# Patient Record
Sex: Male | Born: 1960 | Race: White | Hispanic: No | Marital: Married | Smoking: Former smoker
Health system: Southern US, Community
[De-identification: ages and names within clinical notes are randomized; demographics above are authoritative.]

## PROBLEM LIST (undated history)

## (undated) DIAGNOSIS — K222 Esophageal obstruction: Secondary | ICD-10-CM

## (undated) DIAGNOSIS — E785 Hyperlipidemia, unspecified: Secondary | ICD-10-CM

## (undated) DIAGNOSIS — M1990 Unspecified osteoarthritis, unspecified site: Secondary | ICD-10-CM

## (undated) DIAGNOSIS — K648 Other hemorrhoids: Secondary | ICD-10-CM

## (undated) DIAGNOSIS — K5790 Diverticulosis of intestine, part unspecified, without perforation or abscess without bleeding: Secondary | ICD-10-CM

## (undated) DIAGNOSIS — K219 Gastro-esophageal reflux disease without esophagitis: Secondary | ICD-10-CM

## (undated) DIAGNOSIS — K635 Polyp of colon: Secondary | ICD-10-CM

## (undated) DIAGNOSIS — K449 Diaphragmatic hernia without obstruction or gangrene: Secondary | ICD-10-CM

## (undated) DIAGNOSIS — Z8719 Personal history of other diseases of the digestive system: Secondary | ICD-10-CM

## (undated) DIAGNOSIS — D414 Neoplasm of uncertain behavior of bladder: Secondary | ICD-10-CM

## (undated) HISTORY — PX: TONSILLECTOMY: SUR1361

## (undated) HISTORY — PX: ELBOW SURGERY: SHX618

## (undated) HISTORY — PX: COLONOSCOPY: SHX174

## (undated) HISTORY — DX: Diaphragmatic hernia without obstruction or gangrene: K44.9

## (undated) HISTORY — DX: Diverticulosis of intestine, part unspecified, without perforation or abscess without bleeding: K57.90

## (undated) HISTORY — DX: Esophageal obstruction: K22.2

## (undated) HISTORY — DX: Other hemorrhoids: K64.8

## (undated) HISTORY — PX: POLYPECTOMY: SHX149

## (undated) HISTORY — DX: Polyp of colon: K63.5

## (undated) HISTORY — DX: Neoplasm of uncertain behavior of bladder: D41.4

## (undated) HISTORY — DX: Personal history of other diseases of the digestive system: Z87.19

## (undated) HISTORY — DX: Hyperlipidemia, unspecified: E78.5

---

## 2000-02-08 ENCOUNTER — Encounter: Payer: Self-pay | Admitting: Orthopaedic Surgery

## 2000-02-08 ENCOUNTER — Encounter: Admission: RE | Admit: 2000-02-08 | Discharge: 2000-02-08 | Payer: Self-pay | Admitting: Orthopaedic Surgery

## 2000-10-01 ENCOUNTER — Encounter: Admission: RE | Admit: 2000-10-01 | Discharge: 2000-10-01 | Payer: Self-pay | Admitting: Orthopaedic Surgery

## 2000-10-01 ENCOUNTER — Encounter: Payer: Self-pay | Admitting: Orthopaedic Surgery

## 2001-05-21 HISTORY — PX: ANTERIOR CRUCIATE LIGAMENT REPAIR: SHX115

## 2001-05-21 HISTORY — PX: ESOPHAGEAL DILATION: SHX303

## 2001-06-11 ENCOUNTER — Encounter: Payer: Self-pay | Admitting: Gastroenterology

## 2006-05-07 ENCOUNTER — Encounter: Admission: RE | Admit: 2006-05-07 | Discharge: 2006-05-07 | Payer: Self-pay | Admitting: Orthopaedic Surgery

## 2006-05-21 HISTORY — PX: KNEE SURGERY: SHX244

## 2007-05-22 HISTORY — PX: ELBOW ARTHROPLASTY: SHX928

## 2008-01-23 IMAGING — CT CT EXTREM UP W/O CM*R*
3 of 4 series · 12 of 27 positions shown, 14 images · IV contrast (agent unspecified)
Comparison: none

CLINICAL DATA: Chronic right elbow pain of unknown etiology. 
 CT RIGHT ELBOW WITHOUT CONTRAST:
TECHNIQUE: Multidetector CT imaging was performed according to the standard protocol.  No intravenous contrast was administered.  Multiplanar CT image reconstructions were also generated.

[Series 3: upper ext · axial · 0.23mm/px · z∈[+20,+110]mm · 4 of 122 slices shown, 5 images (1 of 2)]
[im 25/122  soft-tissue]
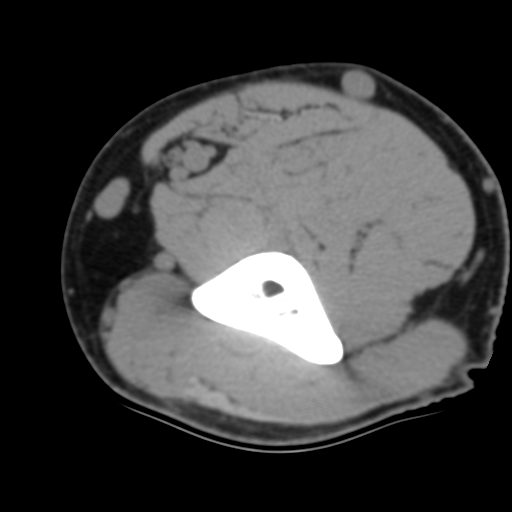
[im 25/122  bone]
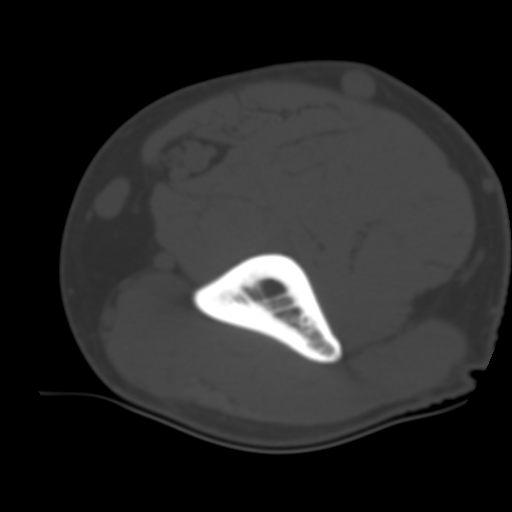
[im 49/122  bone]
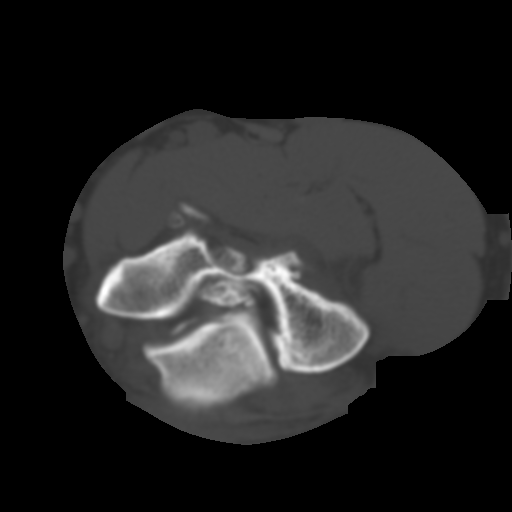
[im 73/122  bone]
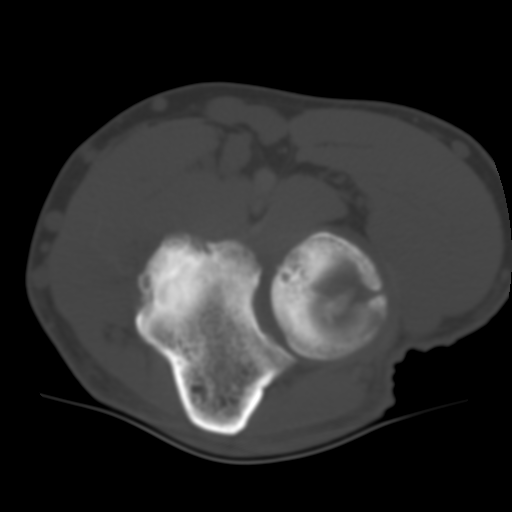
[im 97/122  bone]
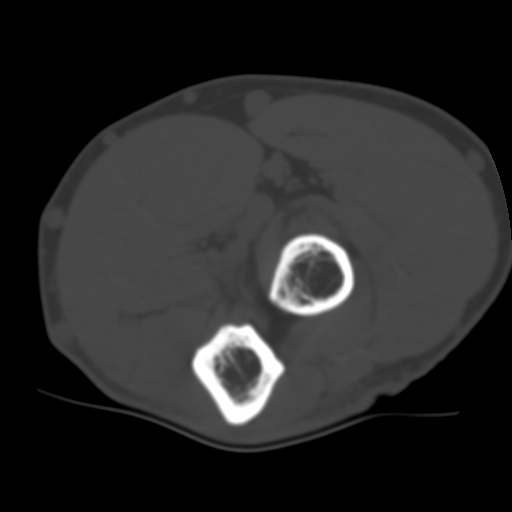

[Series 4: upper ext · axial · 0.23mm/px · z∈[+28,+103]mm · 3 of 122 slices shown (2 of 2)]
[im 31/122  bone]
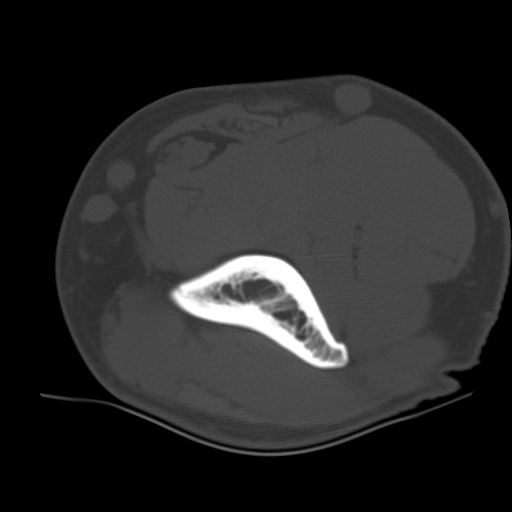
[im 61/122  bone]
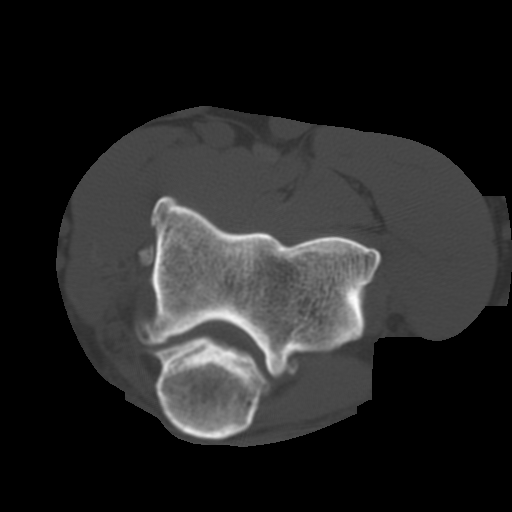
[im 91/122  bone]
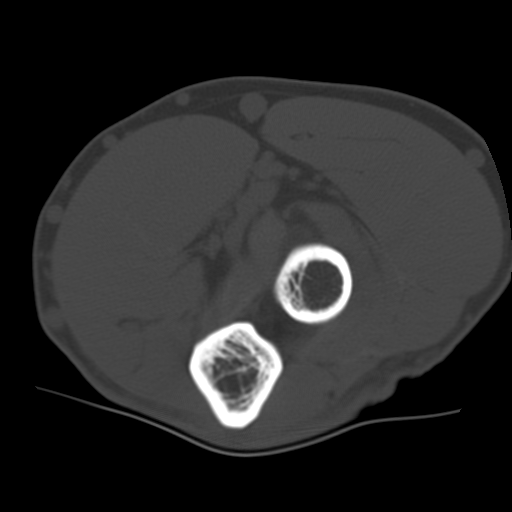

[Series 200: coronal · coronal · 0.30mm/px · 5 of 60 slices shown, 6 images]
[im 20/60  bone]
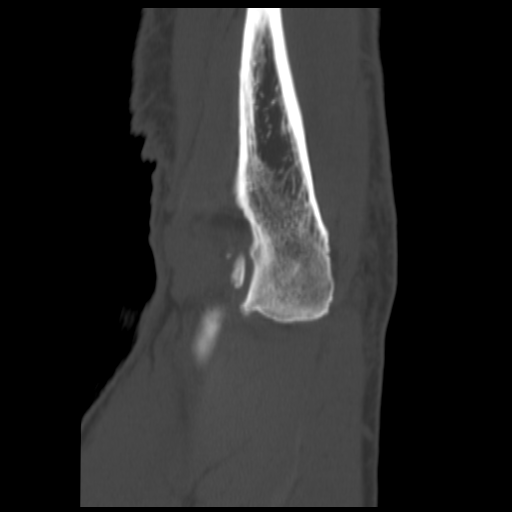
[im 25/60  bone]
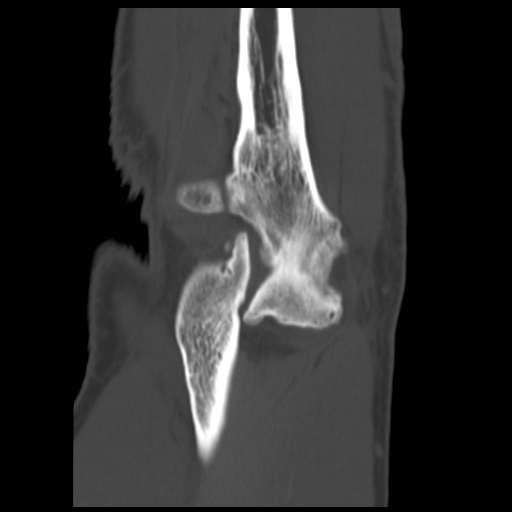
[im 30/60  soft-tissue]
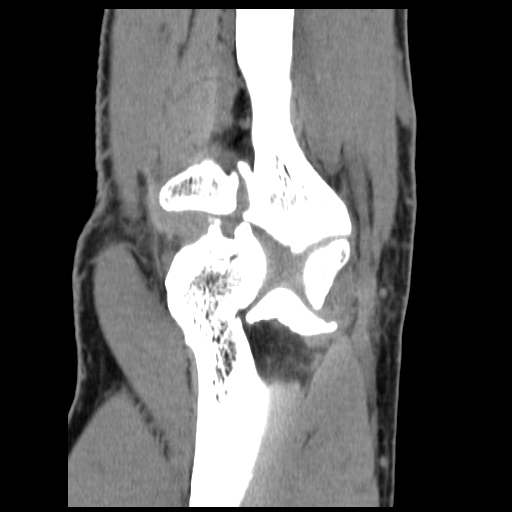
[im 30/60  bone]
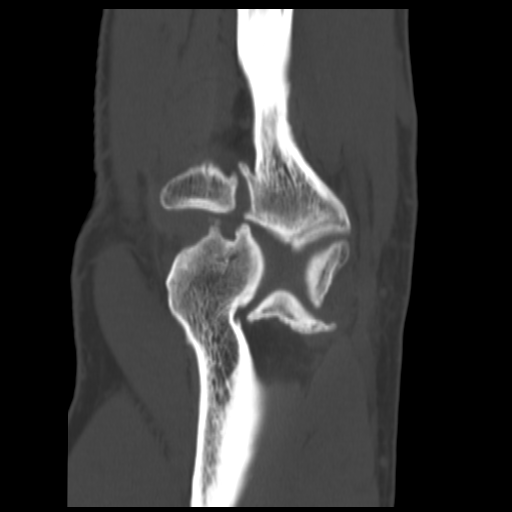
[im 35/60  bone]
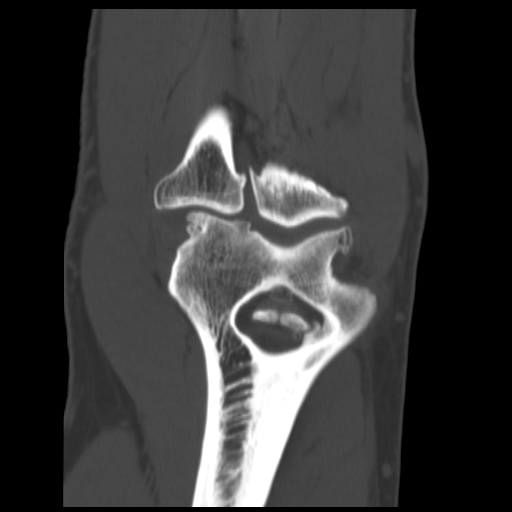
[im 40/60  bone]
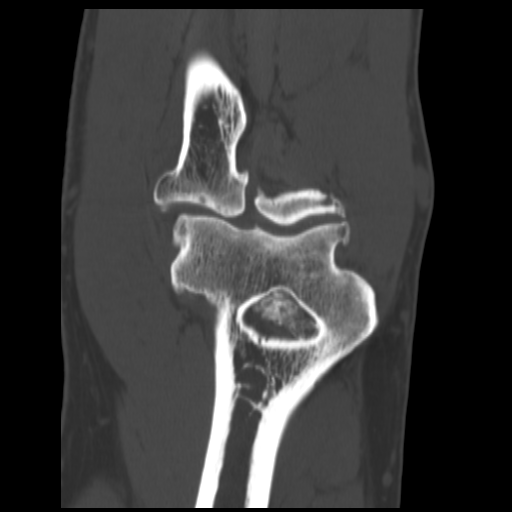

[12 of 27 positions shown; findings below may reference images not displayed]

FINDINGS: The patient has multiple intra-articular loose bodies located throughout the joint.  There are osteoarthritic type changes with hyaline cartilage loss and early subchondral cyst formation affecting the humerus, the radius, and the ulna.  There are marginal osteophytes.  The muscles and tendons appear intact.
IMPRESSION: 1.  Apparent osteoarthritis throughout the elbow joint with hyaline cartilage loss, joint space narrowing, osteophyte formation, subchondral cyst formation, and multiple intra-articular loose bodies.  I suppose one might consider a diagnosis of primary or secondary osteochondromatosis but I think it is more likely that we are dealing with changes related to osteoarthritis.

## 2008-09-29 ENCOUNTER — Ambulatory Visit: Payer: Self-pay | Admitting: Gastroenterology

## 2008-09-29 ENCOUNTER — Telehealth: Payer: Self-pay | Admitting: Physician Assistant

## 2008-09-29 DIAGNOSIS — K625 Hemorrhage of anus and rectum: Secondary | ICD-10-CM

## 2008-09-29 DIAGNOSIS — K219 Gastro-esophageal reflux disease without esophagitis: Secondary | ICD-10-CM

## 2008-09-29 DIAGNOSIS — K6289 Other specified diseases of anus and rectum: Secondary | ICD-10-CM

## 2008-09-29 DIAGNOSIS — K222 Esophageal obstruction: Secondary | ICD-10-CM

## 2008-10-27 ENCOUNTER — Telehealth: Payer: Self-pay | Admitting: Physician Assistant

## 2008-10-28 ENCOUNTER — Ambulatory Visit: Payer: Self-pay | Admitting: Gastroenterology

## 2008-10-28 ENCOUNTER — Encounter: Payer: Self-pay | Admitting: Gastroenterology

## 2008-11-03 ENCOUNTER — Encounter: Payer: Self-pay | Admitting: Gastroenterology

## 2009-10-18 ENCOUNTER — Telehealth: Payer: Self-pay | Admitting: Gastroenterology

## 2009-10-19 ENCOUNTER — Ambulatory Visit: Payer: Self-pay | Admitting: Gastroenterology

## 2009-10-19 DIAGNOSIS — Z860100 Personal history of colon polyps, unspecified: Secondary | ICD-10-CM | POA: Insufficient documentation

## 2009-10-19 DIAGNOSIS — K5730 Diverticulosis of large intestine without perforation or abscess without bleeding: Secondary | ICD-10-CM | POA: Insufficient documentation

## 2009-10-19 DIAGNOSIS — K648 Other hemorrhoids: Secondary | ICD-10-CM | POA: Insufficient documentation

## 2009-10-19 LAB — CONVERTED CEMR LAB
Basophils Absolute: 0 10*3/uL (ref 0.0–0.1)
Basophils Relative: 0.5 % (ref 0.0–3.0)
Eosinophils Absolute: 0.1 10*3/uL (ref 0.0–0.7)
Eosinophils Relative: 1.7 % (ref 0.0–5.0)
HCT: 41.3 % (ref 39.0–52.0)
Hemoglobin: 14.5 g/dL (ref 13.0–17.0)
Lymphocytes Relative: 24.8 % (ref 12.0–46.0)
Lymphs Abs: 1.9 10*3/uL (ref 0.7–4.0)
MCHC: 35.2 g/dL (ref 30.0–36.0)
MCV: 90 fL (ref 78.0–100.0)
Monocytes Absolute: 0.5 10*3/uL (ref 0.1–1.0)
Monocytes Relative: 6.7 % (ref 3.0–12.0)
Neutro Abs: 5.1 10*3/uL (ref 1.4–7.7)
Neutrophils Relative %: 66.3 % (ref 43.0–77.0)
Platelets: 240 10*3/uL (ref 150.0–400.0)
RBC: 4.58 M/uL (ref 4.22–5.81)
RDW: 13 % (ref 11.5–14.6)
WBC: 7.6 10*3/uL (ref 4.5–10.5)

## 2010-06-20 NOTE — Assessment & Plan Note (Signed)
Summary: rectal bleeding/sheri   History of Present Illness Visit Type: Follow-up Visit Primary GI MD: Elie Goody MD [facility] Primary Provider: Rudi Heap, MD Requesting Provider: n/a Chief Complaint: Patient c/o several months worsening brbpr. 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 Patient denies any abdominal pain. No diarrhea or constipation. He also denies any nausea or fever. History of Present Illness:   50 Y.O MALE KNOWN TO DR. Russella Dar WHO UNDERWENT COLONOSCOPY ABOUT ONE YEAR AGO FOR C/O RECTAL BLEEDING. HA HAS INTERNAL HEMORRHOIDS WHICH WERE INJECTED,MILD DIVERTICULOSIS,AND 2 TINY POLYPS IN THE SIGMOID-HYPERPLASTIC. HE SAYS THE INJECTION THERAPY HELPED FOR AWHILE THEN THE BLEEDING GRADUALLY STARTED BACK AGAIN. OVER THE PAST FEW WEEKS HE HAS BEEN HAVING MORE FREQUENT, AND LARGER VOLUME BLEEDING THAN HE HAS HAD IN THE PAST. HE RELATES BRB ON THE TISSUE AND DRIPPING IN THE COMMODE. NO RECTAL OR ABDOMINAL PAIN, NO CONSTIPATION,STRAINING ETC.           Current Medications (verified): 1)  Prilosec Otc 20 Mg Tbec (Omeprazole Magnesium) .... Take One Every Other Day  Allergies (verified): No Known Drug Allergies  Past History:  Past Medical History: GERD, HX OF ESOPHAGEAL STRICTURE, S/P DILATION 2003 family hx hemachromatosis father/PT IS HETEROZYGOUS FOR C282Y MUTATION Hyperlipidemia bladder polyp, HX;S/P RESECTION DIVERTICULOSIS HYPERPLASTIC COLON POLYPS INTERNAL HEMORRHOIDS  Past Surgical History: Tonsillectomy arthroscopic surgery on right knee left knee ACL replacement polyp removed from bladder elbow surgery -right COLONOSCOPY 6/10,  Family History: Reviewed history from 09/29/2008 and no changes required. No FH of Colon Cancer: Family History of Colon Polyps:father  Social History: Reviewed history from 09/29/2008 and no changes required. Occupation: Art gallery manager Married with one  daughter Patient is a former smoker. - quit 30 years ago Alcohol Use - yes 2 Per day Daily Caffeine Use 3 per day Illicit Drug Use - no Patient gets regular exercise.  Review of Systems  The patient denies allergy/sinus, anemia, anxiety-new, arthritis/joint pain, back pain, blood in urine, breast changes/lumps, change in vision, confusion, cough, coughing up blood, depression-new, fainting, fatigue, fever, headaches-new, hearing problems, heart murmur, heart rhythm changes, itching, menstrual pain, muscle pains/cramps, night sweats, nosebleeds, pregnancy symptoms, shortness of breath, skin rash, sleeping problems, sore throat, swelling of feet/legs, swollen lymph glands, thirst - excessive , urination - excessive , urination changes/pain, urine leakage, vision changes, and voice change.         ROS OTHERWISE AS IN HPI  Vital Signs:  Patient profile:   50 year old male Height:      74 inches Weight:      232.25 pounds BMI:     29.93 BSA:     2.32 Pulse rate:   68 / minute Pulse rhythm:   regular BP sitting:   126 / 86  (left arm)  Vitals Entered By: Lamona Curl CMA Duncan Dull) (October 19, 2009 9:35 AM)  Physical Exam  General:  Well developed, well nourished, no acute distress. Head:  Normocephalic and atraumatic. Eyes:  PERRLA, no icterus. Lungs:  Clear throughout to auscultation. Heart:  Regular rate and rhythm; no murmurs, rubs,  or bruits. Abdomen:  SOFT, NONTENDER, NO MASS OR HSM,BS+ Rectal:  NOT DONE TODAY Extremities:  No clubbing, cyanosis, edema or deformities noted. Neurologic:  Alert and  oriented x4;  grossly normal neurologically. Psych:  Alert and cooperative. Normal mood and affect.   Impression & Recommendations:  Problem # 1:  RECTAL BLEEDING (ICD-569.3) Assessment Deteriorated 49 Y.O MALE WITH KNOWN INTERNAL HEMORRHOIDS,WITH RECURRENT HEMATOCHEZIA- CONSISTENT WITH HEMORRHOIDAL BLEEDING. HE HAD INJECTION OF HEMORRHOIDS ONE YEAR AGAIN WITH BRIEF RESOLUTION  OF BLEEDING.  CBC TODAY ANUSOL HC SUPP at bedtime  X 2 WEEKS THEN AS NEEDED. DISCUSSED OPTIONS OF CONSERVATIVE MANAGEMENT/MEDS,VS REPEAT PROCEDURE-WITH BANDING AS INJECTION DID NOT  RESOLVE THE BLEEDING FOR LONG,VS .SURGICAL REFERRAL. HE WANTS TO THINK ABOUT  THE BANDING AND WILL CALL BACK TO SCHEDULE WITH DR. Russella Dar. Orders: TLB-CBC Platelet - w/Differential ([id number])  Problem # 2:  GERD (ICD-530.81) Assessment: Comment Only STABLE ON PRILOSEC.  Problem # 3:  PERSONAL HX COLONIC POLYPS (ICD-V12.72) Assessment: Comment Only HYPERPLASTIC,LAST COLON 6/10-DUE FOR FOLLOW UP 2020  Patient Instructions: 1)  Your physician has requested that you have the following labwork done today: Go to basement level.  2)  We sent perscription for Anusol Suppositories to your pharmacy, [facility] in [place]. 3)  Call back to schedule  the hemorrhoidal banding with Dr. Russella Dar. 4)  Hemorrhoid brochure. 5)  Copy sent to : Rudi Heap, MD 6)  The medication list was reviewed and reconciled.  All changed / newly prescribed medications were explained.  A complete medication list was provided to the patient / caregiver. Prescriptions: ANUSOL-HC 25 MG SUPP (HYDROCORTISONE ACETATE) Use 1 suppository at bedtime x 14 days daily, then as needed.  #14 x 2   Entered by:   Lowry Ram NCMA   Authorized by:   Sammuel Cooper PA-c   Signed by:   Lowry Ram NCMA on 10/19/2009   Method used:   Electronically to        [id number] (retail[facility]       [address]       [place], [place]  [address]       Ph: [phone] or [phone]       Fax: [phone]   RxID:   [id number] ANUSOL-HC 25 MG SUPP (HYDROCORTISONE ACETATE) Use 1 suppository at bedtime x 14 days daily, then as needed.  #14 x 2   Entered by:   Lowry Ram NCMA   Authorized by:   Sammuel Cooper PA-c   Signed by:   Lowry Ram NCMA on 10/19/2009   Method used:   Electronically to        [facility]  [address]  (retail)       [address]       [place], [place]  [address]       Ph: [phone]       Fax: [phone]   RxID:   [id number]  Pt wanted me to send to [facility] instead.

## 2010-06-20 NOTE — Progress Notes (Signed)
Summary: triage  Phone Note Call from Patient Call back at [phone]   Caller: Patient Call For: Dr. Russella Dar Reason for Call: Talk to Nurse Summary of Call: would like to be worked in for "a lot" of rectal bleeding Initial call taken by: Vallarie Mare,  Oct 18, 2009 9:55 AM  Follow-up for Phone Call        Left message for patient to call back Darcey Nora RN, [facility]  Oct 18, 2009 10:19 AM  Patient with large amount of bright red rectal bleeding with BM, dripping blood after a BM.  He hasn't been using creams or suppositories.  Denies pain or itching.  Internal hemorrhoid was injected at colon 10/2008. He has been treated with analpram in the past.  Please advise refill on analpram or office visit. Follow-up by: Darcey Nora RN, CGRN,  Oct 18, 2009 2:05 PM  Additional Follow-up for Phone Call Additional follow up Details #1::        Can see him in the office today or try Analpram, sitz baths and see tomorrow if not better. His choice. Additional Follow-up by: Meryl Dare MD Clementeen Graham,  Oct 18, 2009 2:16 PM    Additional Follow-up for Phone Call Additional follow up Details #2::    Patient  would like to be seen tomorrow.  Appt given with Mike Gip PA 10/19/09 9:30 Follow-up by: Darcey Nora RN, CGRN,  Oct 18, 2009 2:29 PM

## 2010-09-04 ENCOUNTER — Telehealth: Payer: Self-pay | Admitting: Gastroenterology

## 2010-09-04 NOTE — Telephone Encounter (Signed)
I have rescheduled with the patient to see Willette Cluster RNP on 09/06/10 9:30

## 2010-09-06 ENCOUNTER — Encounter: Payer: Self-pay | Admitting: Nurse Practitioner

## 2010-09-06 ENCOUNTER — Ambulatory Visit (INDEPENDENT_AMBULATORY_CARE_PROVIDER_SITE_OTHER): Payer: BC Managed Care – PPO | Admitting: Nurse Practitioner

## 2010-09-06 VITALS — BP 140/98 | HR 60 | Ht 74.0 in | Wt 233.2 lb

## 2010-09-06 DIAGNOSIS — K648 Other hemorrhoids: Secondary | ICD-10-CM

## 2010-09-06 NOTE — Progress Notes (Signed)
Reviewed and agree with management. Deondria Puryear D. Jadamarie Butson, M.D., FACG  

## 2010-09-06 NOTE — Progress Notes (Signed)
William Hood [id number] Sep 03, 1960   History of Present Illness: Mr. William Hood is a 50 year old male known to Dr. Russella Hood for history of esophageal strictures and hemorrhoids. He had a colonoscopy in June 2010 for evaluation of hematochezia. Findings included mild diverticulosis and 2 small polyps in the sigmoid as well as internal hemorrhoids which were injected.. The polyps were hyperplastic as it turned out. Patient was last seen June 2011 for recurrent rectal bleeding. At that time he was treated with a steroid suppositories. Repeat lower endoscopy with injection and / or banding versus surgical referral was discussed in the event bleeding did not respond to conservative management. Mr. William Hood called the office a few days ago for severe hemorrhoidal pain and was given an appointment for today. He still has intermittent bleeding but anal pain is his biggest problem. Patient does not strain with bowel movements. Stools are not formed.   Current Medications, Allergies, Past Medical History, Past Surgical History, Family History and Social History were reviewed in [employer] record.   Physical Exam: General: Well developed , well nourished, no acute distress Head: Normocephalic and atraumatic Eyes:  sclerae anicteric,conjunctive pink. Ears: Normal auditory acuity Mouth: No deformity or lesions Neck: Supple, no masses.  Lungs: Clear throughout to auscultation Heart: Regular rate and rhythm; no murmurs heard Abdomen: Soft, nontender, nondistended. No masses or hepatomegaly noted. Normal bowel sounds Rectal: 2 swollen hemorrhoids, nonreducible, somewhat firm but not blue. When I see dissection as the attending today without warning he is now back board and will be on a Pattersonx Due to discomfort, digital rectal examination was not done. Musculoskeletal: Symmetrical with no gross deformities  Skin: No lesions on visible extremities Extremities: No edema or deformities  noted Neurological: Alert oriented x 4, grossly nonfocal Cervical Nodes:  No significant cervical adenopathy Psychological:  Alert and cooperative. Normal mood and affect  Assessment and Plan:

## 2010-09-06 NOTE — Patient Instructions (Signed)
We have made you an appointment to see Dr. Esmeralda Links at [facility] for tomorrow, 09-07-2010. Arrive at 3:00 Pm for a 3:15 Pm appontment.

## 2010-09-06 NOTE — Assessment & Plan Note (Signed)
The patient has 2 large protruding hemorrhoids which I could not reduce. Patient had injection of internal hemorrhoids during his June 2010 colonoscopy. He has continued to have intermittent bleeding and pain. [facility] and treatment.

## 2010-09-19 ENCOUNTER — Ambulatory Visit: Payer: Self-pay | Admitting: Gastroenterology

## 2010-10-24 ENCOUNTER — Encounter (INDEPENDENT_AMBULATORY_CARE_PROVIDER_SITE_OTHER): Payer: Self-pay | Admitting: General Surgery

## 2012-07-16 ENCOUNTER — Ambulatory Visit (INDEPENDENT_AMBULATORY_CARE_PROVIDER_SITE_OTHER): Payer: BC Managed Care – PPO | Admitting: Gastroenterology

## 2012-07-16 ENCOUNTER — Encounter: Payer: Self-pay | Admitting: Gastroenterology

## 2012-07-16 VITALS — BP 124/92 | HR 76 | Ht 73.5 in | Wt 234.5 lb

## 2012-07-16 DIAGNOSIS — K921 Melena: Secondary | ICD-10-CM

## 2012-07-16 DIAGNOSIS — K648 Other hemorrhoids: Secondary | ICD-10-CM

## 2012-07-16 DIAGNOSIS — K219 Gastro-esophageal reflux disease without esophagitis: Secondary | ICD-10-CM

## 2012-07-16 NOTE — Patient Instructions (Addendum)
You have been scheduled for an appointment with Dr. Lindie Spruce at [facility]. Your appointment is on 08/05/12 at 11:40am. Please arrive at 11:20am for registration. Make certain to bring a list of current medications, including any over the counter medications or vitamins. Also bring your co-pay if you have one as well as your insurance cards. [facility] is located at [address]. Should you need to reschedule your appointment, please contact them at [phone].  Please start Align. This puts good bacteria back into your colon. You should take 1 capsule by mouth once daily. It can be purchased over the counter. Coupon was given.  Thank you for choosing me and [facility].  Venita Lick. Pleas Koch., MD., Clementeen Graham

## 2012-07-16 NOTE — Progress Notes (Signed)
History of Present Illness: This is a 52 year old male with ongoing rectal bleeding and prolapsing hemorrhoids associated with bowel movements. He also notes frequent loose stools and these symptoms have slightly improved with Activia yogurt. He underwent colonoscopy in June 2010 showing internal hemorrhoids and small hyperplastic polyps. His hemorrhoids were injected. He had recurrent hemorrhoids symptoms after that and saw Dr. Frederik Schmidt performed banding procedures in 2012. He has had persistent problems with rectal bleeding with almost every bowel movement and sometimes significant amounts of blood with bowel movements.  Current Medications, Allergies, Past Medical History, Past Surgical History, Family History and Social History were reviewed in [employer] record.  Physical Exam: General: Well developed , well nourished, no acute distress Head: Normocephalic and atraumatic Eyes:  sclerae anicteric, EOMI Ears: Normal auditory acuity Mouth: No deformity or lesions Lungs: Clear throughout to auscultation Heart: Regular rate and rhythm; no murmurs, rubs or bruits Abdomen: Soft, non tender and non distended. No masses, hepatosplenomegaly or hernias noted. Normal Bowel sounds Rectal: No external or internal lesions noted, no tenderness, Hemoccult negative stool the vault Musculoskeletal: Symmetrical with no gross deformities  Pulses:  Normal pulses noted Extremities: No clubbing, cyanosis, edema or deformities noted Neurological: Alert oriented x 4, grossly nonfocal Psychological:  Alert and cooperative. Normal mood and affect  Assessment and Recommendations:  1. Internal hemorrhoids complicated by significant recurrent bleeding and frequent prolapse. Begin Align for looser stools. Since he has failed both injection sclerosis and banding I've recommended he return for surgical evaluation to a hemorrhoidectomy.  2. Colorectal cancer screening, average risk. Hyperplastic  polyps on last colonoscopy. Next colonoscopy due in June 2020.   3. Chronic GERD. Continue standard antireflux measures and omeprazole 20 mg daily.

## 2012-08-05 ENCOUNTER — Ambulatory Visit (INDEPENDENT_AMBULATORY_CARE_PROVIDER_SITE_OTHER): Payer: BC Managed Care – PPO | Admitting: General Surgery

## 2012-08-05 ENCOUNTER — Encounter (INDEPENDENT_AMBULATORY_CARE_PROVIDER_SITE_OTHER): Payer: Self-pay | Admitting: General Surgery

## 2012-08-05 VITALS — BP 126/78 | HR 78 | Temp 98.6°F | Resp 18 | Ht 74.0 in | Wt 233.0 lb

## 2012-08-05 DIAGNOSIS — K641 Second degree hemorrhoids: Secondary | ICD-10-CM

## 2012-08-05 DIAGNOSIS — K649 Unspecified hemorrhoids: Secondary | ICD-10-CM

## 2012-08-05 MED ORDER — HYDROCORTISONE ACETATE 25 MG RE SUPP: 25.0000 mg | Freq: Two times a day (BID) | RECTAL | Status: DC

## 2012-08-05 NOTE — Progress Notes (Signed)
The patient continues to have bleeding and discomfort with hemorrhoids. His biggest problem now is prolapse of the hemorrhoids with significant bleeding. He is attempted and no over-the-counter treatment.  On examination today the patient has an external hemorrhoid which almost seems thrombosed at approximately the 7 to 8:00 position. At the 5:00 position on the right side with 12:00 being directly posterior the patient has a large hemorrhoidal complex internally which does prolapse. He did not bleed today in the office. He does appear to have lesser grade hemorrhoids in other areas.  Based on the patient's symptoms I will treat him with Anusol a.c. Suppositories however since we tried a more conservative route with sclerosis and hemorrhoidal banding in the past I do think that he is a good candidate for an examination under anesthesia along with internal and external hemorrhoidectomy at least one column and possibly a 2 column hemorrhoidectomy. The patient understands this and wishes to proceed as soon as possible.

## 2012-10-14 ENCOUNTER — Ambulatory Visit: Payer: Self-pay | Admitting: Family Medicine

## 2012-10-17 ENCOUNTER — Telehealth (INDEPENDENT_AMBULATORY_CARE_PROVIDER_SITE_OTHER): Payer: Self-pay

## 2012-10-17 ENCOUNTER — Telehealth (INDEPENDENT_AMBULATORY_CARE_PROVIDER_SITE_OTHER): Payer: Self-pay | Admitting: *Deleted

## 2012-10-17 NOTE — Telephone Encounter (Signed)
I returned patients call after scheduling surgery and told him the prep before surgery per Dr Lindie Spruce is to do a fleets enema the morning of surgery. I told him he could get this at any pharmacy OTC.

## 2012-10-17 NOTE — Telephone Encounter (Signed)
Returned patients call and told him he could be out of work around 3 weeks. Expressed this was only an estimate of time and he could go back 1 week sooner or 1 week later depending on his recovery speed. I explained he should expect moderate pain and discomfort the first 7-10 days and then should start seeing improvement. Patient will think about surgery and call back.

## 2012-10-17 NOTE — Telephone Encounter (Signed)
I returned patients call. Can refer to my telephone encounter for my note.

## 2012-10-17 NOTE — Telephone Encounter (Signed)
Patient called to ask about his recovery time and his restrictions following surgery so he could decide when to schedule his surgery.

## 2012-11-19 ENCOUNTER — Encounter (HOSPITAL_BASED_OUTPATIENT_CLINIC_OR_DEPARTMENT_OTHER): Payer: Self-pay | Admitting: *Deleted

## 2012-11-24 ENCOUNTER — Other Ambulatory Visit (INDEPENDENT_AMBULATORY_CARE_PROVIDER_SITE_OTHER): Payer: Self-pay | Admitting: General Surgery

## 2012-11-25 ENCOUNTER — Encounter (HOSPITAL_BASED_OUTPATIENT_CLINIC_OR_DEPARTMENT_OTHER)
Admission: RE | Disposition: A | Discharge status: Home / Self Care | Payer: Self-pay | Source: Ambulatory Visit | Attending: General Surgery

## 2012-11-25 ENCOUNTER — Encounter (HOSPITAL_BASED_OUTPATIENT_CLINIC_OR_DEPARTMENT_OTHER): Payer: Self-pay | Admitting: *Deleted

## 2012-11-25 ENCOUNTER — Encounter (HOSPITAL_BASED_OUTPATIENT_CLINIC_OR_DEPARTMENT_OTHER): Payer: Self-pay | Admitting: Anesthesiology

## 2012-11-25 ENCOUNTER — Ambulatory Visit (HOSPITAL_BASED_OUTPATIENT_CLINIC_OR_DEPARTMENT_OTHER): Payer: 59 | Admitting: Anesthesiology

## 2012-11-25 ENCOUNTER — Ambulatory Visit (HOSPITAL_BASED_OUTPATIENT_CLINIC_OR_DEPARTMENT_OTHER)
Admission: RE | Admit: 2012-11-25 | Discharge: 2012-11-25 | Disposition: A | Discharge status: Home / Self Care | Payer: 59 | Source: Ambulatory Visit | Attending: General Surgery | Admitting: General Surgery

## 2012-11-25 DIAGNOSIS — K648 Other hemorrhoids: Secondary | ICD-10-CM | POA: Insufficient documentation

## 2012-11-25 DIAGNOSIS — K5730 Diverticulosis of large intestine without perforation or abscess without bleeding: Secondary | ICD-10-CM | POA: Insufficient documentation

## 2012-11-25 DIAGNOSIS — K222 Esophageal obstruction: Secondary | ICD-10-CM | POA: Insufficient documentation

## 2012-11-25 DIAGNOSIS — K645 Perianal venous thrombosis: Secondary | ICD-10-CM | POA: Insufficient documentation

## 2012-11-25 DIAGNOSIS — K644 Residual hemorrhoidal skin tags: Secondary | ICD-10-CM

## 2012-11-25 DIAGNOSIS — Z87891 Personal history of nicotine dependence: Secondary | ICD-10-CM | POA: Insufficient documentation

## 2012-11-25 DIAGNOSIS — M129 Arthropathy, unspecified: Secondary | ICD-10-CM | POA: Insufficient documentation

## 2012-11-25 DIAGNOSIS — E785 Hyperlipidemia, unspecified: Secondary | ICD-10-CM | POA: Insufficient documentation

## 2012-11-25 DIAGNOSIS — K219 Gastro-esophageal reflux disease without esophagitis: Secondary | ICD-10-CM | POA: Insufficient documentation

## 2012-11-25 DIAGNOSIS — K449 Diaphragmatic hernia without obstruction or gangrene: Secondary | ICD-10-CM | POA: Insufficient documentation

## 2012-11-25 HISTORY — PX: EVALUATION UNDER ANESTHESIA WITH HEMORRHOIDECTOMY AND PROCTOSCOPY: SHX5625

## 2012-11-25 HISTORY — DX: Unspecified osteoarthritis, unspecified site: M19.90

## 2012-11-25 HISTORY — DX: Gastro-esophageal reflux disease without esophagitis: K21.9

## 2012-11-25 SURGERY — EXAM UNDER ANESTHESIA WITH HEMORRHOIDECTOMY AND PROCTOSCOPY
Anesthesia: General | Site: Rectum | Wound class: Contaminated

## 2012-11-25 MED ORDER — MIDAZOLAM HCL 2 MG/2ML IJ SOLN: 1.0000 mg | INTRAMUSCULAR | Status: DC | PRN

## 2012-11-25 MED ORDER — OXYCODONE-ACETAMINOPHEN 10-325 MG PO TABS: 1.0000 | ORAL_TABLET | ORAL | Status: DC | PRN

## 2012-11-25 MED ORDER — DEXTROSE 5 % IV SOLN
2.0000 g | INTRAVENOUS | Status: DC | PRN
  Administered 2012-11-25: 2 g via INTRAVENOUS

## 2012-11-25 MED ORDER — EPHEDRINE SULFATE 50 MG/ML IJ SOLN
INTRAMUSCULAR | Status: DC | PRN
  Administered 2012-11-25: 10 mg via INTRAVENOUS

## 2012-11-25 MED ORDER — DEXAMETHASONE SODIUM PHOSPHATE 4 MG/ML IJ SOLN
INTRAMUSCULAR | Status: DC | PRN
  Administered 2012-11-25: 8 mg via INTRAVENOUS

## 2012-11-25 MED ORDER — FENTANYL CITRATE 0.05 MG/ML IJ SOLN
INTRAMUSCULAR | Status: DC | PRN
  Administered 2012-11-25: 100 ug via INTRAVENOUS

## 2012-11-25 MED ORDER — PROPOFOL 10 MG/ML IV BOLUS
INTRAVENOUS | Status: DC | PRN
  Administered 2012-11-25: 200 mg via INTRAVENOUS

## 2012-11-25 MED ORDER — GLYCOPYRROLATE 0.2 MG/ML IJ SOLN
INTRAMUSCULAR | Status: DC | PRN
  Administered 2012-11-25: 0.2 mg via INTRAVENOUS

## 2012-11-25 MED ORDER — BUPIVACAINE-EPINEPHRINE 0.25% -1:200000 IJ SOLN
INTRAMUSCULAR | Status: DC | PRN
  Administered 2012-11-25: 20 mL

## 2012-11-25 MED ORDER — DIBUCAINE 1 % RE OINT
TOPICAL_OINTMENT | RECTAL | Status: DC | PRN
  Administered 2012-11-25: 1 via RECTAL

## 2012-11-25 MED ORDER — MIDAZOLAM HCL 5 MG/5ML IJ SOLN
INTRAMUSCULAR | Status: DC | PRN
  Administered 2012-11-25: 2 mg via INTRAVENOUS

## 2012-11-25 MED ORDER — ONDANSETRON HCL 4 MG/2ML IJ SOLN
INTRAMUSCULAR | Status: DC | PRN
  Administered 2012-11-25: 4 mg via INTRAVENOUS

## 2012-11-25 MED ORDER — OXYCODONE HCL 5 MG/5ML PO SOLN: 5.0000 mg | Freq: Once | ORAL | Status: AC | PRN

## 2012-11-25 MED ORDER — FENTANYL CITRATE 0.05 MG/ML IJ SOLN: 50.0000 ug | INTRAMUSCULAR | Status: DC | PRN

## 2012-11-25 MED ORDER — HYDROMORPHONE HCL PF 1 MG/ML IJ SOLN
0.2500 mg | INTRAMUSCULAR | Status: DC | PRN
  Administered 2012-11-25 (×4): 0.5 mg via INTRAVENOUS

## 2012-11-25 MED ORDER — LIDOCAINE HCL (CARDIAC) 20 MG/ML IV SOLN
INTRAVENOUS | Status: DC | PRN
  Administered 2012-11-25: 80 mg via INTRAVENOUS

## 2012-11-25 MED ORDER — OXYCODONE HCL 5 MG PO TABS
5.0000 mg | ORAL_TABLET | Freq: Once | ORAL | Status: AC | PRN
  Administered 2012-11-25: 5 mg via ORAL

## 2012-11-25 MED ORDER — METOCLOPRAMIDE HCL 5 MG/ML IJ SOLN: 10.0000 mg | Freq: Once | INTRAMUSCULAR | Status: DC | PRN

## 2012-11-25 MED ORDER — LACTATED RINGERS IV SOLN
INTRAVENOUS | Status: DC
  Administered 2012-11-25: 07:00:00 via INTRAVENOUS

## 2012-11-25 SURGICAL SUPPLY — 48 items
BENZOIN TINCTURE PRP APPL 2/3 (GAUZE/BANDAGES/DRESSINGS) ×2 IMPLANT
BLADE SURG 15 STRL LF DISP TIS (BLADE) ×1 IMPLANT
BLADE SURG 15 STRL SS (BLADE) ×1
BRIEF STRETCH FOR OB PAD LRG (UNDERPADS AND DIAPERS) ×2 IMPLANT
CANISTER SUCTION 1200CC (MISCELLANEOUS) ×2 IMPLANT
CLEANER CAUTERY TIP 5X5 PAD (MISCELLANEOUS) ×1 IMPLANT
CLOTH BEACON ORANGE TIMEOUT ST (SAFETY) ×2 IMPLANT
COVER MAYO STAND STRL (DRAPES) ×2 IMPLANT
COVER TABLE BACK 60X90 (DRAPES) IMPLANT
DECANTER SPIKE VIAL GLASS SM (MISCELLANEOUS) IMPLANT
DRAPE PED LAPAROTOMY (DRAPES) IMPLANT
DRAPE UTILITY XL STRL (DRAPES) ×2 IMPLANT
DRSG PAD ABDOMINAL 8X10 ST (GAUZE/BANDAGES/DRESSINGS) ×4 IMPLANT
ELECT REM PT RETURN 9FT ADLT (ELECTROSURGICAL) ×2
ELECTRODE REM PT RTRN 9FT ADLT (ELECTROSURGICAL) ×1 IMPLANT
GAUZE SPONGE 4X4 12PLY STRL LF (GAUZE/BANDAGES/DRESSINGS) ×2 IMPLANT
GAUZE SPONGE 4X4 16PLY XRAY LF (GAUZE/BANDAGES/DRESSINGS) IMPLANT
GLOVE BIO SURGEON STRL SZ 6.5 (GLOVE) ×2 IMPLANT
GLOVE BIOGEL PI IND STRL 7.0 (GLOVE) ×1 IMPLANT
GLOVE BIOGEL PI IND STRL 8 (GLOVE) ×1 IMPLANT
GLOVE BIOGEL PI INDICATOR 7.0 (GLOVE) ×1
GLOVE BIOGEL PI INDICATOR 8 (GLOVE) ×1
GLOVE ECLIPSE 7.5 STRL STRAW (GLOVE) ×2 IMPLANT
GLOVE EXAM NITRILE MD LF STRL (GLOVE) ×2 IMPLANT
GOWN PREVENTION PLUS XLARGE (GOWN DISPOSABLE) ×2 IMPLANT
NDL SAFETY ECLIPSE 18X1.5 (NEEDLE) IMPLANT
NEEDLE HYPO 18GX1.5 SHARP (NEEDLE)
NEEDLE HYPO 22GX1.5 SAFETY (NEEDLE) ×2 IMPLANT
NS IRRIG 1000ML POUR BTL (IV SOLUTION) IMPLANT
PACK BASIN DAY SURGERY FS (CUSTOM PROCEDURE TRAY) ×2 IMPLANT
PACK LITHOTOMY IV (CUSTOM PROCEDURE TRAY) ×2 IMPLANT
PAD CLEANER CAUTERY TIP 5X5 (MISCELLANEOUS) ×1
PENCIL BUTTON HOLSTER BLD 10FT (ELECTRODE) ×2 IMPLANT
SPONGE GAUZE 4X4 12PLY (GAUZE/BANDAGES/DRESSINGS) ×2 IMPLANT
SPONGE HEMORRHOID 8X3CM (HEMOSTASIS) ×2 IMPLANT
SPONGE SURGIFOAM ABS GEL 100 (HEMOSTASIS) ×2 IMPLANT
SURGILUBE 2OZ TUBE FLIPTOP (MISCELLANEOUS) ×2 IMPLANT
SUT CHROMIC 3 0 SH 27 (SUTURE) ×6 IMPLANT
SYR BULB 3OZ (MISCELLANEOUS) IMPLANT
SYR CONTROL 10ML LL (SYRINGE) ×2 IMPLANT
TAPE CLOTH 2X10 TAN LF (GAUZE/BANDAGES/DRESSINGS) ×2 IMPLANT
TOWEL OR 17X24 6PK STRL BLUE (TOWEL DISPOSABLE) ×4 IMPLANT
TOWEL OR NON WOVEN STRL DISP B (DISPOSABLE) ×2 IMPLANT
TRAY DSU PREP LF (CUSTOM PROCEDURE TRAY) ×2 IMPLANT
TRAY PROCTOSCOPIC FIBER OPTIC (SET/KITS/TRAYS/PACK) ×2 IMPLANT
TUBE CONNECTING 20X1/4 (TUBING) ×2 IMPLANT
UNDERPAD 30X30 INCONTINENT (UNDERPADS AND DIAPERS) ×2 IMPLANT
YANKAUER SUCT BULB TIP NO VENT (SUCTIONS) ×2 IMPLANT

## 2012-11-25 NOTE — H&P (Signed)
William Hood is an 52 y.o. male.   Chief Complaint: Hemorrhoids HPI:  The patient continues to have bleeding and discomfort with hemorrhoids. His biggest problem now is prolapse of the hemorrhoids with significant bleeding. He is attempted and no over-the-counter treatment.  On examination today the patient has an external hemorrhoid which almost seems thrombosed at approximately the 7 to 8:00 position. At the 5:00 position on the right side with 12:00 being directly posterior the patient has a large hemorrhoidal complex internally which does prolapse. He did not bleed today in the office. He does appear to have lesser grade hemorrhoids in other areas.  Based on the patient's symptoms I will treat him with Anusol a.c. Suppositories however since we tried a more conservative route with sclerosis and hemorrhoidal banding in the past I do think that he is a good candidate for an examination under anesthesia along with internal and external hemorrhoidectomy at least one column and possibly a 2 column hemorrhoidectomy. The patient understands this and wishes to proceed as soon as possible.        Past Medical History  Diagnosis Date  . History of gastroesophageal reflux (GERD)   . Hyperlipemia   . Bladder polyp   . Diverticulosis   . Hyperplastic colonic polyp   . Internal hemorrhoid   . Esophageal stricture   . Hiatal hernia   . GERD (gastroesophageal reflux disease)   . Arthritis     Past Surgical History  Procedure Laterality Date  . Tonsillectomy    . Knee surgery  2008    Arthroscopic, Right Knee  . Anterior cruciate ligament repair  2003    Left knee acl replacement  . Polypectomy      from Bladder-cysto  . Elbow surgery      Right  . Esophageal dilation  2003  . Elbow arthroplasty  2009    right  . Colonoscopy      Family History  Problem Relation Age of Onset  . Colon cancer Neg Hx   . Colon polyps Father    Social History:  reports that he quit smoking about 32  years ago. He has never used smokeless tobacco. He reports that he drinks about 7.0 ounces of alcohol per week. He reports that he does not use illicit drugs.  Allergies: No Known Allergies  Medications Prior to Admission  Medication Sig Dispense Refill  . Coenzyme Q10 (CO Q-10) 300 MG CAPS Take 1 tablet by mouth daily.      . hydrocortisone (ANUSOL-HC) 25 MG suppository Place 1 suppository (25 mg total) rectally 2 (two) times daily.  28 suppository  0  . omeprazole (PRILOSEC OTC) 20 MG tablet Take 1 tablet by  Mouth every other day.        No results found for this or any previous visit (from the past 48 hour(s)). No results found.  Review of Systems  Gastrointestinal: Positive for blood in stool.  All other systems reviewed and are negative.    Blood pressure 150/95, pulse 60, temperature 98.1 F (36.7 C), temperature source Oral, resp. rate 18, height 6\' 2"  (1.88 m), weight 103.511 kg (228 lb 3.2 oz), SpO2 96.00%. Physical Exam  Constitutional: He is oriented to person, place, and time. He appears well-developed and well-nourished.  HENT:  Head: Normocephalic and atraumatic.  Eyes: Conjunctivae are normal. Pupils are equal, round, and reactive to light.  Neck: Normal range of motion. Neck supple.  Cardiovascular: Normal rate, regular rhythm and normal heart sounds.  Respiratory: Effort normal and breath sounds normal.  GI: Soft. Bowel sounds are normal.  Genitourinary:     Musculoskeletal: Normal range of motion.  Neurological: He is alert and oriented to person, place, and time. He has normal reflexes.  Psychiatric: He has a normal mood and affect. His behavior is normal. Judgment and thought content normal.     Assessment/Plan Hemorrhoidal disease  For EUA/Hemorrhoidectomy, likely two column, and banding.  Cherylynn Ridges 11/25/2012, 7:27 AM

## 2012-11-25 NOTE — Op Note (Signed)
OPERATIVE REPORT  DATE OF OPERATION: 11/25/2012  PATIENT:  William Hood  52 y.o. male  PRE-OPERATIVE DIAGNOSIS:  Grade III internal and external hemorrhoids  POST-OPERATIVE DIAGNOSIS:  Grade III internal and external hemorrhoids  PROCEDURE:  Procedure(s): EXAM UNDER ANESTHESIA WITH 2 COLUMN HEMORRHOIDECTOMY AND PROCTOSCOPY, RUBBER BAND LIGATION OF HEMORRHOID X 2  SURGEON:  Surgeon(s): Cherylynn Ridges, MD  ASSISTANT: None  ANESTHESIA:   general  EBL: <30 ml  BLOOD ADMINISTERED: none  DRAINS: none   SPECIMEN:  Source of Specimen:  Escised hemorrhoids  COUNTS CORRECT:  YES  PROCEDURE DETAILS: The patient was taken to the operating room and placed on the table in the supine position. After an adequate general laryngeal airway anesthetic was administered he was placed in the lithotomy position and then subsequently prepped and draped in usual sterile manner.  A proper time out was performed identifying the patient and the procedure to be performed. We started off with a proctoscopy taking the rigid sigmoidoscope up to 25 cm, noting nothing but normal mucosa.  Once the sigmoidoscope was withdrawn we used an endoscope an anal speculum nor to inspect the area in the distal anal rectal vault. The patient had 2 large hemorrhoidal complexes one at the 4 to 5:00 position, with 12:00 being directly anterior, and the second one being at approximately the 7:00 position. Both were excised in a similar manner using a 3-0 chromic at the base of the internal hemorrhoid internally, excising the hemorrhoidal complex superficial to the sphincter using electrocautery, then reapproximating the mucosal edges and the skin up to a small opening on the external surface using running locking stitch of 3-0 chromic.  Once both these columns of hemorrhoids were removed, a double rubber band ligation was performed of the last hemorrhoidal complex internally at the 11:00 position. Subsequently we irrigated with  saline. Adequate hemostasis was identified. We used a began ointment attached to a Gelfoam roll x2 and placed in the in the anal rectal vault. We injected submucosally with quarter percent Marcaine with epi a sterile dressing was applied. All needle counts sponge counts and instrument counts were correct.  PATIENT DISPOSITION:  PACU - hemodynamically stable.   Cherylynn Ridges 7/8/20148:34 AM

## 2012-11-25 NOTE — Anesthesia Preprocedure Evaluation (Signed)
Anesthesia Evaluation  Patient identified by MRN, date of birth, ID band Patient awake    Reviewed: Allergy & Precautions, H&P , NPO status , Patient's Chart, lab work & pertinent test results, reviewed documented beta blocker date and time   Airway Mallampati: II TM Distance: >3 FB Neck ROM: full    Dental   Pulmonary neg pulmonary ROS,  breath sounds clear to auscultation        Cardiovascular negative cardio ROS  Rhythm:regular     Neuro/Psych  Neuromuscular disease negative psych ROS   GI/Hepatic Neg liver ROS, hiatal hernia, GERD-  ,  Endo/Other  negative endocrine ROS  Renal/GU negative Renal ROS  negative genitourinary   Musculoskeletal   Abdominal   Peds  Hematology negative hematology ROS (+)   Anesthesia Other Findings See surgeon's H&P   Reproductive/Obstetrics negative OB ROS                           Anesthesia Physical Anesthesia Plan  ASA: II  Anesthesia Plan: General   Post-op Pain Management:    Induction: Intravenous  Airway Management Planned: Oral ETT  Additional Equipment:   Intra-op Plan:   Post-operative Plan: Extubation in OR  Informed Consent: I have reviewed the patients History and Physical, chart, labs and discussed the procedure including the risks, benefits and alternatives for the proposed anesthesia with the patient or authorized representative who has indicated his/her understanding and acceptance.   Dental Advisory Given  Plan Discussed with: CRNA and Surgeon  Anesthesia Plan Comments:         Anesthesia Quick Evaluation

## 2012-11-25 NOTE — Transfer of Care (Signed)
Immediate Anesthesia Transfer of Care Note  Patient: William Hood  Procedure(s) Performed: Procedure(s) with comments: EXAM UNDER ANESTHESIA WITH 2 COLUMN HEMORRHOIDECTOMY AND PROCTOSCOPY (N/A) - Lithotomy  Patient Location: PACU  Anesthesia Type:General  Level of Consciousness: awake and alert   Airway & Oxygen Therapy: Patient Spontanous Breathing and Patient connected to face mask oxygen  Post-op Assessment: Report given to PACU RN and Post -op Vital signs reviewed and stable  Post vital signs: Reviewed and stable  Complications: No apparent anesthesia complications

## 2012-11-25 NOTE — Anesthesia Procedure Notes (Signed)
Procedure Name: LMA Insertion Performed by: Charo Philipp W Pre-anesthesia Checklist: Patient identified, Timeout performed, Emergency Drugs available, Suction available and Patient being monitored Patient Re-evaluated:Patient Re-evaluated prior to inductionOxygen Delivery Method: Circle system utilized Preoxygenation: Pre-oxygenation with 100% oxygen Intubation Type: IV induction Ventilation: Mask ventilation without difficulty LMA: LMA inserted LMA Size: 4.0 Number of attempts: 1 Placement Confirmation: positive ETCO2 Tube secured with: Tape Dental Injury: Teeth and Oropharynx as per pre-operative assessment      

## 2012-11-25 NOTE — Anesthesia Postprocedure Evaluation (Signed)
Anesthesia Post Note  Patient: William Hood  Procedure(s) Performed: Procedure(s) (LRB): EXAM UNDER ANESTHESIA WITH 2 COLUMN HEMORRHOIDECTOMY AND PROCTOSCOPY (N/A)  Anesthesia type: General  Patient location: PACU  Post pain: Pain level controlled  Post assessment: Patient's Cardiovascular Status Stable  Last Vitals:  Filed Vitals:   11/25/12 1038  BP: 130/91  Pulse: 52  Temp: 36.5 C  Resp: 16    Post vital signs: Reviewed and stable  Level of consciousness: alert  Complications: No apparent anesthesia complications

## 2012-11-26 ENCOUNTER — Encounter (HOSPITAL_BASED_OUTPATIENT_CLINIC_OR_DEPARTMENT_OTHER): Payer: Self-pay | Admitting: General Surgery

## 2012-12-09 ENCOUNTER — Ambulatory Visit (INDEPENDENT_AMBULATORY_CARE_PROVIDER_SITE_OTHER): Payer: 59 | Admitting: General Surgery

## 2012-12-09 ENCOUNTER — Encounter (INDEPENDENT_AMBULATORY_CARE_PROVIDER_SITE_OTHER): Payer: Self-pay | Admitting: General Surgery

## 2012-12-09 VITALS — BP 124/90 | HR 64 | Resp 14 | Ht 74.0 in | Wt 230.0 lb

## 2012-12-09 DIAGNOSIS — Z09 Encounter for follow-up examination after completed treatment for conditions other than malignant neoplasm: Secondary | ICD-10-CM

## 2012-12-09 NOTE — Progress Notes (Signed)
The patient continues to have all after 2 column internal and external hemorrhoidectomy. On examination today the area with the most open wound is on the left side and still has some purulent drainage. There is also some mild stools seepage in that area however the patient states that he does not have any rectal incontinence.  The patient is eating well. He is taking a stool softener on a regular basis. I did not perform an endoscopic or digital examination today. However he back in 6 weeks for that examination. At that time his wound should be mostly healed the he should be able to tolerate the examination well.

## 2013-02-05 ENCOUNTER — Encounter (INDEPENDENT_AMBULATORY_CARE_PROVIDER_SITE_OTHER): Payer: 59 | Admitting: General Surgery

## 2013-02-16 ENCOUNTER — Encounter (INDEPENDENT_AMBULATORY_CARE_PROVIDER_SITE_OTHER): Payer: Self-pay | Admitting: General Surgery

## 2013-07-15 ENCOUNTER — Ambulatory Visit: Payer: 59 | Attending: Specialist | Admitting: Physical Therapy

## 2013-07-15 DIAGNOSIS — IMO0001 Reserved for inherently not codable concepts without codable children: Secondary | ICD-10-CM | POA: Insufficient documentation

## 2013-07-15 DIAGNOSIS — M25469 Effusion, unspecified knee: Secondary | ICD-10-CM | POA: Insufficient documentation

## 2013-07-15 DIAGNOSIS — R5381 Other malaise: Secondary | ICD-10-CM | POA: Insufficient documentation

## 2013-07-15 DIAGNOSIS — M25669 Stiffness of unspecified knee, not elsewhere classified: Secondary | ICD-10-CM | POA: Insufficient documentation

## 2013-07-15 DIAGNOSIS — M25569 Pain in unspecified knee: Secondary | ICD-10-CM | POA: Insufficient documentation

## 2013-07-17 ENCOUNTER — Ambulatory Visit: Payer: 59 | Admitting: *Deleted

## 2013-11-30 ENCOUNTER — Encounter: Payer: Self-pay | Admitting: Family Medicine

## 2013-11-30 ENCOUNTER — Ambulatory Visit (INDEPENDENT_AMBULATORY_CARE_PROVIDER_SITE_OTHER): Payer: 59

## 2013-11-30 ENCOUNTER — Ambulatory Visit (INDEPENDENT_AMBULATORY_CARE_PROVIDER_SITE_OTHER): Payer: 59 | Admitting: Family Medicine

## 2013-11-30 ENCOUNTER — Encounter (INDEPENDENT_AMBULATORY_CARE_PROVIDER_SITE_OTHER): Payer: Self-pay

## 2013-11-30 VITALS — BP 105/71 | HR 84 | Temp 98.3°F | Ht 74.0 in | Wt 232.0 lb

## 2013-11-30 DIAGNOSIS — M25532 Pain in left wrist: Secondary | ICD-10-CM

## 2013-11-30 DIAGNOSIS — M25539 Pain in unspecified wrist: Secondary | ICD-10-CM

## 2013-11-30 NOTE — Progress Notes (Signed)
   Subjective:    Patient ID: William Hood, male    DOB: May 17, 1961, 53 y.o.   MRN: [id number]  HPI This 53 y.o. male presents for evaluation of left wrist pain after striking it on a rock from a fall.   Review of Systems C/o left wrist pain   No chest pain, SOB, HA, dizziness, vision change, N/V, diarrhea, constipation, dysuria, urinary urgency or frequency or rash.  Objective:   Physical Exam  Vital signs noted  Well developed well nourished male.  HEENT - Head atraumatic Normocephalic Respiratory - Lungs CTA bilateral Cardiac - RRR S1 and S2 without murmur MS - TTP left distal ulna region, no deformity, FROM left wrist  Xray left wrist - No fx Prelimnary reading by Iverson Alamin     Assessment & Plan:  Wrist pain, acute, left - Plan: DG Wrist Complete Left Continue to wear cock up splint and take motrin otc as directed.  Follow up prn  Lysbeth Penner FNP

## 2014-02-05 ENCOUNTER — Other Ambulatory Visit: Payer: Self-pay | Admitting: Family Medicine

## 2014-02-18 DEATH — deceased
